# Patient Record
Sex: Female | Born: 1985 | Race: White | Hispanic: No | Marital: Married | Smoking: Never smoker
Health system: Southern US, Community
[De-identification: ages and names within clinical notes are randomized; demographics above are authoritative.]

## PROBLEM LIST (undated history)

## (undated) DIAGNOSIS — Z789 Other specified health status: Secondary | ICD-10-CM

## (undated) DIAGNOSIS — O24419 Gestational diabetes mellitus in pregnancy, unspecified control: Secondary | ICD-10-CM

## (undated) DIAGNOSIS — E039 Hypothyroidism, unspecified: Secondary | ICD-10-CM

## (undated) HISTORY — DX: Other specified health status: Z78.9

## (undated) HISTORY — DX: Gestational diabetes mellitus in pregnancy, unspecified control: O24.419

## (undated) HISTORY — PX: MR LOWER LEG LEFT (ARMC HX): HXRAD1784

## (undated) HISTORY — PX: OTHER SURGICAL HISTORY: SHX169

## (undated) HISTORY — DX: Hypothyroidism, unspecified: E03.9

---

## 2019-03-20 ENCOUNTER — Other Ambulatory Visit: Payer: Self-pay

## 2019-03-20 ENCOUNTER — Ambulatory Visit
Admission: RE | Admit: 2019-03-20 | Discharge: 2019-03-20 | Disposition: A | Discharge status: Home / Self Care | Payer: Managed Care, Other (non HMO) | Source: Ambulatory Visit | Attending: Family Medicine | Admitting: Family Medicine

## 2019-03-20 ENCOUNTER — Other Ambulatory Visit: Payer: Self-pay | Admitting: Family Medicine

## 2019-03-20 DIAGNOSIS — S60032A Contusion of left middle finger without damage to nail, initial encounter: Secondary | ICD-10-CM

## 2020-02-24 IMAGING — CR LEFT MIDDLE FINGER 2+V
3 series · 3 of 3 positions shown · non-contrast
Comparison: None.

CLINICAL DATA: Fall 2 days ago with third digit pain, initial
encounter

EXAM:
LEFT MIDDLE FINGER 2+V

[x finger pa left]
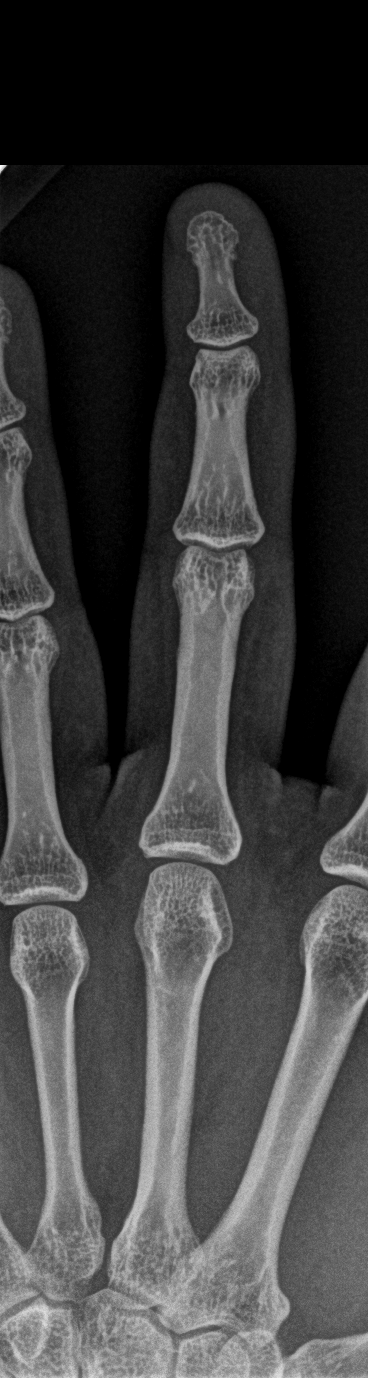

[x finger obl left]
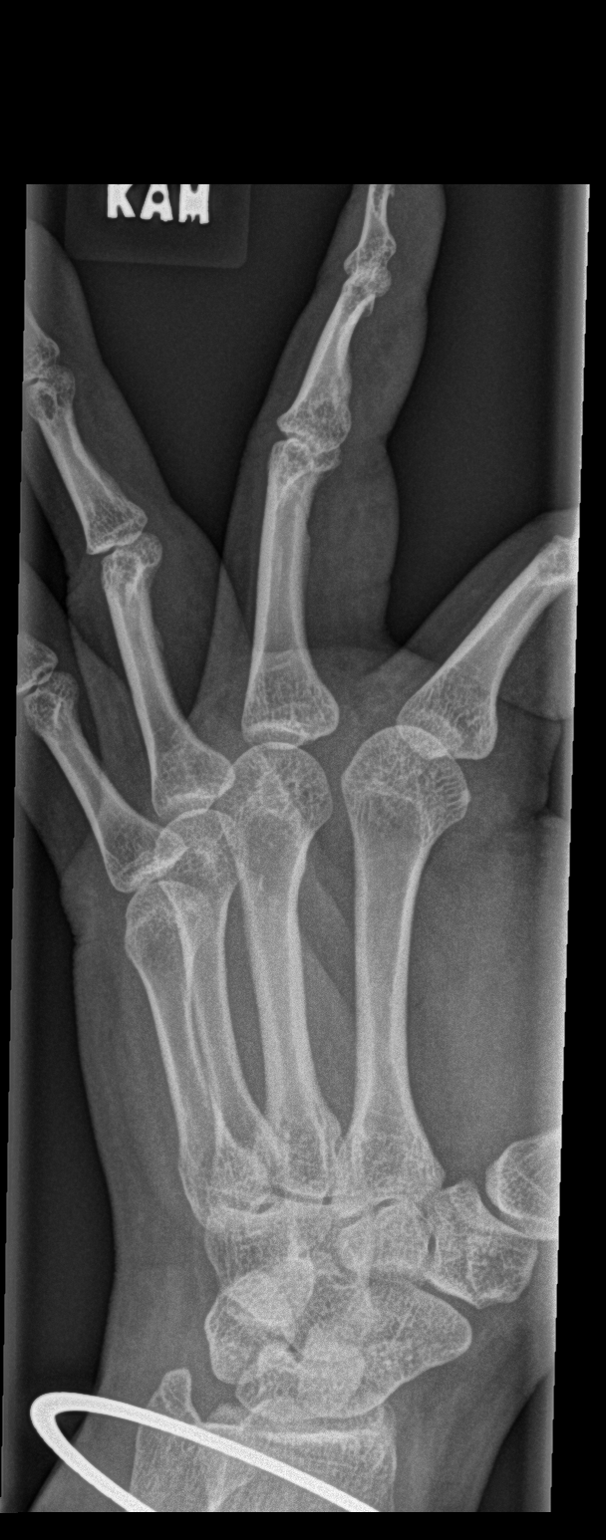

[x finger lat left]
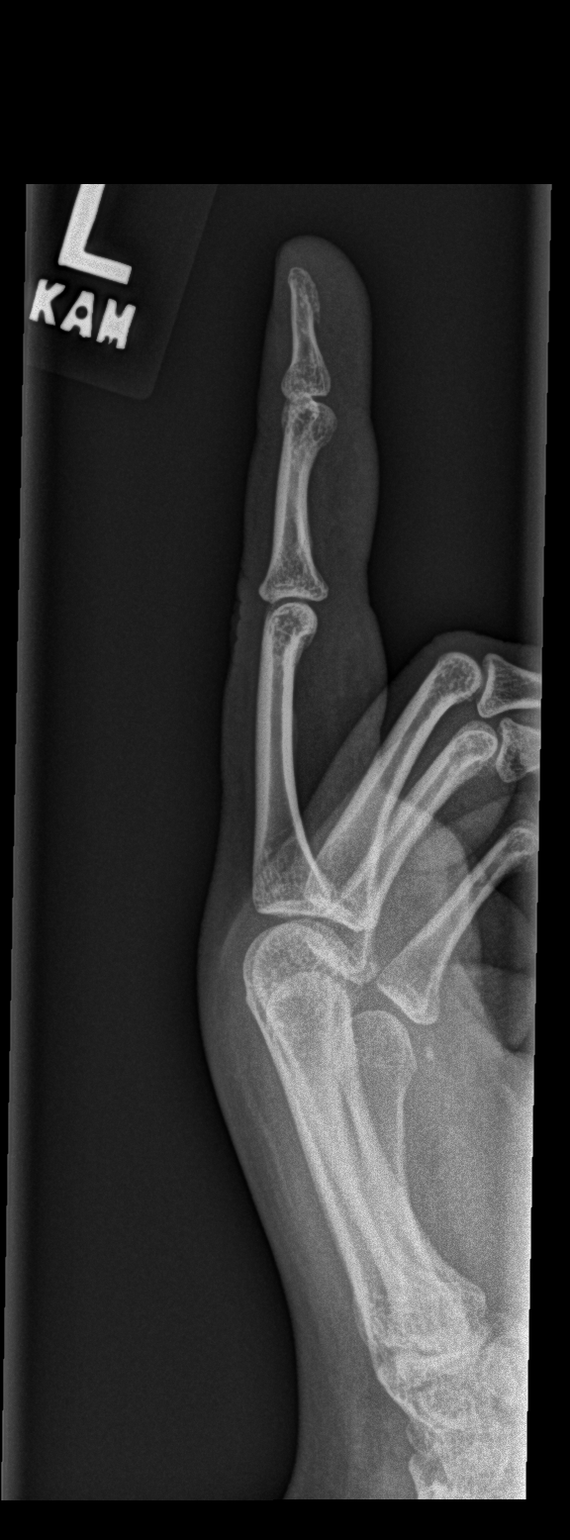

[3 of 3 positions shown; findings below may reference images not displayed]

FINDINGS: There is no evidence of fracture or dislocation. There is no
evidence of arthropathy or other focal bone abnormality. Soft
tissues are unremarkable.
IMPRESSION: No acute abnormality noted.

## 2020-09-11 ENCOUNTER — Other Ambulatory Visit (HOSPITAL_COMMUNITY)
Admission: RE | Admit: 2020-09-11 | Discharge: 2020-09-11 | Disposition: A | Discharge status: Home / Self Care | Payer: Managed Care, Other (non HMO) | Source: Ambulatory Visit | Attending: Family Medicine | Admitting: Family Medicine

## 2020-09-11 ENCOUNTER — Other Ambulatory Visit: Payer: Self-pay | Admitting: Family Medicine

## 2020-09-11 DIAGNOSIS — Z0000 Encounter for general adult medical examination without abnormal findings: Secondary | ICD-10-CM | POA: Diagnosis not present

## 2020-09-19 LAB — CYTOLOGY - PAP
Comment: NEGATIVE
Diagnosis: NEGATIVE
High risk HPV: NEGATIVE

## 2021-03-11 DIAGNOSIS — R7303 Prediabetes: Secondary | ICD-10-CM | POA: Diagnosis not present

## 2021-03-11 DIAGNOSIS — E039 Hypothyroidism, unspecified: Secondary | ICD-10-CM | POA: Diagnosis not present

## 2021-03-26 DIAGNOSIS — Z20822 Contact with and (suspected) exposure to covid-19: Secondary | ICD-10-CM | POA: Diagnosis not present

## 2021-09-22 DIAGNOSIS — M79674 Pain in right toe(s): Secondary | ICD-10-CM | POA: Diagnosis not present

## 2021-09-22 DIAGNOSIS — E039 Hypothyroidism, unspecified: Secondary | ICD-10-CM | POA: Diagnosis not present

## 2021-10-30 ENCOUNTER — Ambulatory Visit (INDEPENDENT_AMBULATORY_CARE_PROVIDER_SITE_OTHER): Payer: BC Managed Care – PPO

## 2021-10-30 ENCOUNTER — Ambulatory Visit (INDEPENDENT_AMBULATORY_CARE_PROVIDER_SITE_OTHER): Payer: BC Managed Care – PPO | Admitting: Podiatry

## 2021-10-30 ENCOUNTER — Other Ambulatory Visit: Payer: Self-pay

## 2021-10-30 DIAGNOSIS — M79671 Pain in right foot: Secondary | ICD-10-CM | POA: Diagnosis not present

## 2021-10-30 DIAGNOSIS — M778 Other enthesopathies, not elsewhere classified: Secondary | ICD-10-CM

## 2021-10-30 DIAGNOSIS — E538 Deficiency of other specified B group vitamins: Secondary | ICD-10-CM | POA: Insufficient documentation

## 2021-10-30 DIAGNOSIS — R7303 Prediabetes: Secondary | ICD-10-CM | POA: Insufficient documentation

## 2021-10-30 DIAGNOSIS — D509 Iron deficiency anemia, unspecified: Secondary | ICD-10-CM | POA: Insufficient documentation

## 2021-10-30 DIAGNOSIS — E039 Hypothyroidism, unspecified: Secondary | ICD-10-CM | POA: Insufficient documentation

## 2021-11-04 ENCOUNTER — Telehealth: Payer: Self-pay | Admitting: Podiatry

## 2021-11-04 MED ORDER — MELOXICAM 15 MG PO TABS: 15.0000 mg | ORAL_TABLET | Freq: Every day | ORAL | 0 refills | Status: DC | PRN

## 2021-11-04 NOTE — Telephone Encounter (Signed)
[facility] at [place]    Pain medication was not called in  -  Patient husband calling to follow up  on this. Please call

## 2021-11-05 NOTE — Telephone Encounter (Signed)
Left message on voicemail that medication has been called in

## 2021-11-05 NOTE — Progress Notes (Signed)
Subjective:   Patient ID: Morgan Douglas, female   DOB: 35 y.o.   MRN: [id number]   HPI 35 year old female presents the office today with her husband present for concerns of right foot pain mostly on the fourth and fifth toe area.  She states that she started a new job standing at work and after 2 weeks of working is when she started noticing discomfort.  No injury that she reports.  At worst her pain is 10/10.  Hurts mostly with working.  She presents today wearing a shoe that is more flexible and states it is not hurting with this type issue.  No numbness or tingling.  No other concerns.   Review of Systems  All other systems reviewed and are negative.  No past medical history on file.    Current Outpatient Medications:    meloxicam (MOBIC) 15 MG tablet, Take 1 tablet (15 mg total) by mouth daily as needed for pain., Disp: 30 tablet, Rfl: 0   triamcinolone cream (KENALOG) 0.1 %, 1 application, Disp: , Rfl:    cyanocobalamin 1000 MCG tablet, Take by mouth., Disp: , Rfl:    EUTHYROX 175 MCG tablet, Take 175 mcg by mouth every morning., Disp: , Rfl:   Not on File        Objective:  Physical Exam  General: AAO x3, NAD  Dermatological: Skin is warm, dry and supple bilateral.There are no open sores, no preulcerative lesions, no rash or signs of infection present.  Vascular: Dorsalis Pedis artery and Posterior Tibial artery pedal pulses are 2/4 bilateral with immedate capillary fill time.  There is no pain with calf compression, swelling, warmth, erythema.   Neruologic: Grossly intact via light touch bilateral.   Musculoskeletal: There is palpation on the left fourth interspace of the right foot.  Mild ischemia in the fourth and fifth toes but this mostly on the MPJs.  No area of pinpoint tenderness.  Trace edema.  There is no erythema or warmth.  Muscular strength 5/5 in all groups tested bilateral.  Gait: Unassisted, Nonantalgic.       Assessment:   Capsulitis right  foot     Plan:  -Treatment options discussed including all alternatives, risks, and complications -Etiology of symptoms were discussed -X-rays obtained reviewed.  No evidence of acute fracture.  Splayfoot present. -As she does not have pain wearing the current shoe, to hold off on immobilization to a surgical shoe which we did discuss today.  We discussed topical anti-inflammatories including Voltaren gel.  Her husband did call and I prescribed meloxicam.  Discussed icing to the area daily.  Discussed change of shoe gear while at work in different style material to the shoe I think will be beneficial for her.  No improvement consider injection.  Vivi Barrack DPM

## 2021-12-01 ENCOUNTER — Other Ambulatory Visit: Payer: Self-pay | Admitting: Podiatry

## 2021-12-31 ENCOUNTER — Other Ambulatory Visit: Payer: Self-pay | Admitting: Podiatry

## 2022-01-08 DIAGNOSIS — R509 Fever, unspecified: Secondary | ICD-10-CM | POA: Diagnosis not present

## 2022-01-08 DIAGNOSIS — J029 Acute pharyngitis, unspecified: Secondary | ICD-10-CM | POA: Diagnosis not present

## 2022-01-08 DIAGNOSIS — B349 Viral infection, unspecified: Secondary | ICD-10-CM | POA: Diagnosis not present

## 2022-01-08 DIAGNOSIS — Z03818 Encounter for observation for suspected exposure to other biological agents ruled out: Secondary | ICD-10-CM | POA: Diagnosis not present

## 2022-06-22 DIAGNOSIS — E039 Hypothyroidism, unspecified: Secondary | ICD-10-CM | POA: Diagnosis not present

## 2022-06-22 DIAGNOSIS — Z13220 Encounter for screening for lipoid disorders: Secondary | ICD-10-CM | POA: Diagnosis not present

## 2022-06-22 DIAGNOSIS — Z0000 Encounter for general adult medical examination without abnormal findings: Secondary | ICD-10-CM | POA: Diagnosis not present

## 2022-06-22 DIAGNOSIS — D509 Iron deficiency anemia, unspecified: Secondary | ICD-10-CM | POA: Diagnosis not present

## 2022-06-22 DIAGNOSIS — R7303 Prediabetes: Secondary | ICD-10-CM | POA: Diagnosis not present

## 2022-08-19 DIAGNOSIS — R799 Abnormal finding of blood chemistry, unspecified: Secondary | ICD-10-CM | POA: Diagnosis not present

## 2022-08-19 DIAGNOSIS — D649 Anemia, unspecified: Secondary | ICD-10-CM | POA: Diagnosis not present

## 2022-08-19 DIAGNOSIS — N644 Mastodynia: Secondary | ICD-10-CM | POA: Diagnosis not present

## 2022-08-19 DIAGNOSIS — Z872 Personal history of diseases of the skin and subcutaneous tissue: Secondary | ICD-10-CM | POA: Diagnosis not present

## 2022-08-20 ENCOUNTER — Other Ambulatory Visit: Payer: Self-pay | Admitting: Family Medicine

## 2022-08-20 DIAGNOSIS — N644 Mastodynia: Secondary | ICD-10-CM

## 2022-09-30 ENCOUNTER — Ambulatory Visit: Payer: Self-pay

## 2022-09-30 ENCOUNTER — Ambulatory Visit
Admission: RE | Admit: 2022-09-30 | Discharge: 2022-09-30 | Disposition: A | Discharge status: Home / Self Care | Payer: BC Managed Care – PPO | Source: Ambulatory Visit | Attending: Family Medicine | Admitting: Family Medicine

## 2022-09-30 DIAGNOSIS — N644 Mastodynia: Secondary | ICD-10-CM

## 2022-09-30 DIAGNOSIS — N611 Abscess of the breast and nipple: Secondary | ICD-10-CM | POA: Diagnosis not present

## 2023-09-07 LAB — OB RESULTS CONSOLE RPR: RPR: NONREACTIVE

## 2023-09-07 LAB — HEPATITIS C ANTIBODY: HCV Ab: NEGATIVE

## 2023-09-07 LAB — OB RESULTS CONSOLE HEPATITIS B SURFACE ANTIGEN: Hepatitis B Surface Ag: NEGATIVE

## 2023-09-07 LAB — OB RESULTS CONSOLE GC/CHLAMYDIA
Chlamydia: NEGATIVE
Neisseria Gonorrhea: NEGATIVE

## 2023-09-07 LAB — OB RESULTS CONSOLE RUBELLA ANTIBODY, IGM: Rubella: NON-IMMUNE/NOT IMMUNE

## 2023-09-07 LAB — OB RESULTS CONSOLE ANTIBODY SCREEN: Antibody Screen: NEGATIVE

## 2023-09-07 LAB — OB RESULTS CONSOLE HIV ANTIBODY (ROUTINE TESTING): HIV: NONREACTIVE

## 2023-09-16 ENCOUNTER — Other Ambulatory Visit: Payer: Self-pay

## 2023-09-16 ENCOUNTER — Other Ambulatory Visit: Payer: Self-pay | Admitting: Obstetrics & Gynecology

## 2023-09-16 ENCOUNTER — Telehealth: Payer: Self-pay

## 2023-09-16 DIAGNOSIS — O285 Abnormal chromosomal and genetic finding on antenatal screening of mother: Secondary | ICD-10-CM

## 2023-09-21 ENCOUNTER — Encounter: Payer: Self-pay | Admitting: *Deleted

## 2023-09-21 DIAGNOSIS — O280 Abnormal hematological finding on antenatal screening of mother: Secondary | ICD-10-CM | POA: Insufficient documentation

## 2023-09-21 DIAGNOSIS — O99210 Obesity complicating pregnancy, unspecified trimester: Secondary | ICD-10-CM | POA: Insufficient documentation

## 2023-09-21 DIAGNOSIS — O09529 Supervision of elderly multigravida, unspecified trimester: Secondary | ICD-10-CM | POA: Insufficient documentation

## 2023-09-22 ENCOUNTER — Other Ambulatory Visit: Payer: Self-pay

## 2023-09-22 ENCOUNTER — Ambulatory Visit: Payer: Managed Care, Other (non HMO)

## 2023-09-22 ENCOUNTER — Other Ambulatory Visit: Payer: Self-pay | Admitting: *Deleted

## 2023-09-22 ENCOUNTER — Ambulatory Visit: Payer: Managed Care, Other (non HMO) | Attending: Obstetrics and Gynecology

## 2023-09-22 VITALS — BP 113/65 | HR 93

## 2023-09-22 DIAGNOSIS — O09529 Supervision of elderly multigravida, unspecified trimester: Secondary | ICD-10-CM | POA: Diagnosis present

## 2023-09-22 DIAGNOSIS — O285 Abnormal chromosomal and genetic finding on antenatal screening of mother: Secondary | ICD-10-CM | POA: Insufficient documentation

## 2023-09-22 DIAGNOSIS — O280 Abnormal hematological finding on antenatal screening of mother: Secondary | ICD-10-CM

## 2023-09-22 DIAGNOSIS — Z3A13 13 weeks gestation of pregnancy: Secondary | ICD-10-CM

## 2023-09-22 DIAGNOSIS — O99211 Obesity complicating pregnancy, first trimester: Secondary | ICD-10-CM

## 2023-09-22 DIAGNOSIS — O99210 Obesity complicating pregnancy, unspecified trimester: Secondary | ICD-10-CM | POA: Diagnosis present

## 2023-09-22 DIAGNOSIS — E669 Obesity, unspecified: Secondary | ICD-10-CM

## 2023-09-22 DIAGNOSIS — O09521 Supervision of elderly multigravida, first trimester: Secondary | ICD-10-CM

## 2023-09-22 DIAGNOSIS — O99281 Endocrine, nutritional and metabolic diseases complicating pregnancy, first trimester: Secondary | ICD-10-CM

## 2023-09-22 DIAGNOSIS — O99810 Abnormal glucose complicating pregnancy: Secondary | ICD-10-CM

## 2023-09-22 DIAGNOSIS — E079 Disorder of thyroid, unspecified: Secondary | ICD-10-CM

## 2023-09-22 NOTE — Progress Notes (Signed)
[facility] at [facility] [address] Phone:  [phone]   Fax:  [phone]    Name: Morgan Douglas Indication: Low fetal fraction on NIPS.  DOB: 1986-07-13 Age: 37 y.o.   EDD: 03/29/2024 LMP: 06/16/2023 Referring Provider:  Deatra James, MD   EGA: [redacted]w[redacted]d  Genetic Counselor: Sheppard Plumber, MS  OB Hx: [id number] Date of Appointment: 09/22/2023  Accompanied by: Morgan Douglas (FOB). Face to Face Time: 60 Minutes   Pregnancy History:   This is Morgan Douglas's second pregnancy. She has one son. Reports she takes PNVs, iron and B12 supplements, and thyroid medication. Personal history of hypothyroidism. Denies personal history of diabetes, high blood pressure, and seizures. Denies bleeding, infections, and fevers in this pregnancy. Denies using tobacco, alcohol, or street drugs in this pregnancy.   Family History: A three-generation pedigree was created and scanned into Epic under the Media tab.  The couple report that their 81 yo son was diagnosed with autism ~2 yo. He has some learning delays. He has not had a genetics evaluation for autism. We discussed that genetic testing for individuals with a clinical diagnosis of autism yields an explanation in only about 20% of cases, and the remaining 80% of cases are left with unknown etiology. Having an affected son increases the chance that the couple's future children will have autism; however, without genetic testing performed on affected family members, it is difficult to assess risk to the pregnancy. The risk may be up to 50% in the case of an identified genetic cause. We also discussed screening for fragile X syndrome, which is one of the most common causes of inherited intellectual disability and autism in males. Fragile X syndrome affects females as well. Morgan Douglas was not found to be a carrier for fragile X syndrome (37 and 29 CGG repeats).  I suggested the couple to speak with their son's  pediatrician for a possible referral to pediatric genetics for an autism evaluation.  Morgan Douglas's reproductive partner Morgan Douglas reports that he had a stent placed ~10 years ago. He reports this was likely due to not taking medications and high stress. He is being followed by a cardiologist.   Maternal ethnicity reported as [place] and paternal ethnicity reported as [place]. Denies Ashkenazi Jewish ancestry.  Family history not remarkable for consanguinity, individuals with birth defects, intellectual disability, multiple spontaneous abortions, still births, or unexplained neonatal death.     Genetic Counseling:   Abnormal NIPS Report:  Morgan Douglas had an abnormal Panorama noninvasive prenatal screening (NIPS) during this pregnancy. The result reported high risk for trisomy 72, trisomy 30, or triploidy due to insufficient fetal fraction. The lab reports a 1 in 17 risk for these conditions based on these results. Morgan Douglas's NIPS was drawn at [redacted]w[redacted]d gestation and had a fetal fraction of 2.0%. Please see lab reports for details.    NIPS analyzes cell-free DNA originating from the placenta that is found in the maternal blood circulation during pregnancy to provide information regarding the presence or absence of extra DNA for chromosomes 13, 18, and 21 as well as the sex chromosomes. Low fetal fraction has been associated with early gestational age, high maternal weight, suboptimal sample collection, pregnancies conceived using in vitro fertilization, use of certain medications such as blood thinners, certain maternal health conditions such as autoimmune disorders, small placentas, fetal chromosomal abnormalities, and normal variation. When low fetal fraction is detected, the lab is not able to analyze the chromosome material from the placenta. Therefore,  the lab then incorporates other factors, such as maternal weight, maternal age, and gestational age to further assess the risk of adverse conditions to the pregnancy.  Based upon the results of the additional analysis, the current pregnancy is at high risk (1 in 85) for triploidy, trisomy 61, or trisomy 30. The risks for trisomy 73 and monosomy X are unchanged. The lab reports this increased risk due to the known association of those conditions with small placentas and low fetal fractions.   We discussed that it is currently unknown why Morgan Douglas has low fetal fraction but may be due to high maternal BMI and early gestation age during the blood draw. We briefly discussed the three conditions that the lab reported the pregnancy is at an increased risk for. Greater than 90% of cases with fetuses affected with trisomy 86, trisomy 35, or triploidy will show signs of the respective condition on ultrasound. Based on these results, we discussed further testing/screening options:  Repeat NIPS. Now that Kerby is a couple of weeks further along in her pregnancy, the fetal fraction may be higher. If the redraw was successful and contained a sufficient fetal fraction, results could clarify the risks for chromosomal aneuploidy in the current pregnancy. The couple was counseled that there is still a chance that a new sample may not contain a sufficient fetal fraction. They were also offered the option of waiting 1-2 additional weeks to allow the placenta to grow further. The couple elected to repeat NIPS today. Morgan Douglas's blood was drawn for Panorama NIPS during today's visit.  Diagnostic Testing. We reviewed the technical aspects, benefits, risks, and limitations of chorionic villus sampling (CVS) and amniocentesis including the 1 in 500 risk for miscarriage. The couple elected to wait for the NIPS results and decide on diagnostic testing accordingly.  Advanced Maternal Age:  We briefly discussed that the chance that Morgan Douglas's current pregnancy would be affected with a chromosome difference based on her age alone is approximately 1 in 123 (~0.8%). This means that there is a 122 in 123  (~99.2%) chance that the fetus would not be affected with a chromosome difference.    Previous Testing Completed:  Morgan Douglas previously completed carrier screening. She screened to not be a carrier for the 14 conditions listed on the test report. Please see report for details. A negative result on carrier screening reduces but does not eliminate the chance of being a carrier.    Patient Plan:  Proceed with: Routine prenatal care. A repeat Panorama NIPS was drawn today.  Informed consent was obtained. All questions were answered.   I spent 60 minutes in the care of the patient today, including face-to-face time reviewing and  discussing the genetic test results and available next steps.   Thank you for sharing in the care of Morgan Douglas with [place].  Please do not hesitate to contact us at [phone] if you have any questions.  Sheppard Plumber, MS Genetic Counselor  Genetic counseling student involved in appointment: No.

## 2023-09-28 LAB — PANORAMA PRENATAL TEST FULL PANEL:PANORAMA TEST PLUS 5 ADDITIONAL MICRODELETIONS: FETAL FRACTION: 3.6

## 2023-09-30 ENCOUNTER — Telehealth: Payer: Self-pay

## 2023-09-30 NOTE — Telephone Encounter (Signed)
I left a voicemail asking patient to call back to discuss test results.

## 2023-09-30 NOTE — Telephone Encounter (Signed)
The patient's husband called back to discuss the Panorama NIPS results. The result is low risk, consistent with a female fetus. This screening significantly reduces but does not eliminate the chance that the current pregnancy has Down syndrome (trisomy 56), trisomy 68, trisomy 54, and common sex chromosome conditions. Please see report for details. Additionally, there are many genetic conditions that cannot be detected by cfDNA.   Fetal sex was not shared with the couple as per their previous request.  Sheppard Plumber, MS Genetic Counselor [facility] [phone]

## 2023-10-12 ENCOUNTER — Ambulatory Visit: Payer: Self-pay

## 2023-11-17 ENCOUNTER — Ambulatory Visit: Payer: Managed Care, Other (non HMO)

## 2024-01-26 ENCOUNTER — Encounter: Attending: Obstetrics & Gynecology | Admitting: Dietician

## 2024-01-26 DIAGNOSIS — O24410 Gestational diabetes mellitus in pregnancy, diet controlled: Secondary | ICD-10-CM | POA: Insufficient documentation

## 2024-01-26 NOTE — Progress Notes (Signed)
 Patient was seen on 01/26/2024 for Gestational Diabetes self-management class at the [facility]. The following learning objectives were met by the patient during this course:  States the definition of Gestational Diabetes States why dietary management is important in controlling blood glucose Describes the effects each nutrient has on blood glucose levels Demonstrates ability to create a balanced meal plan Demonstrates carbohydrate counting  States when to check blood glucose levels Demonstrates proper blood glucose monitoring techniques States the effect of stress and exercise on blood glucose levels States the importance of limiting caffeine and abstaining from alcohol and smoking Introduction of insulin management including mixing, measuring with Pen and syringe, rotating injections sites and treatment/preventions of hypoglycemia   Patient has a meter prior to visit. Patient is instructed to test pre breakfast and 2 hours after each meal. FBS: 96, 115, 128, 99, 127, 164, 128 mg/dL  2 hour Postprandial: Breakfast: 177, 120, 123, 142, 103, 157 mg/dL  Lunch: 387, 564, 332, 123, 128, 115, 146 mg/dL  Dinner: 951, 884, 166, 177, 186, 130 mg/dL    Patient instructed to monitor glucose levels: QID FBS: 60 - <90 1 hour: <140 2 hour: <120  *Patient received handouts: Nutrition Diabetes and Pregnancy Carbohydrate Counting List Blood glucose log Snack ideas for diabetes during pregnancy  Patient will be seen for follow-up as needed.

## 2024-03-02 ENCOUNTER — Telehealth (HOSPITAL_COMMUNITY): Payer: Self-pay | Admitting: *Deleted

## 2024-03-02 LAB — OB RESULTS CONSOLE GBS: GBS: NEGATIVE

## 2024-03-02 NOTE — Telephone Encounter (Signed)
 Preadmission screen

## 2024-03-07 ENCOUNTER — Encounter (HOSPITAL_COMMUNITY): Payer: Self-pay | Admitting: *Deleted

## 2024-03-20 ENCOUNTER — Other Ambulatory Visit: Payer: Self-pay | Admitting: Obstetrics & Gynecology

## 2024-03-23 ENCOUNTER — Inpatient Hospital Stay (HOSPITAL_COMMUNITY)
Admission: RE | Admit: 2024-03-23 | Discharge: 2024-03-26 | DRG: 784 | Disposition: A | Discharge status: Home / Self Care | Attending: Obstetrics and Gynecology | Admitting: Obstetrics and Gynecology

## 2024-03-23 ENCOUNTER — Encounter (HOSPITAL_COMMUNITY): Payer: Self-pay | Admitting: Obstetrics & Gynecology

## 2024-03-23 ENCOUNTER — Inpatient Hospital Stay (HOSPITAL_COMMUNITY): Admission: RE | Admit: 2024-03-23 | Source: Ambulatory Visit

## 2024-03-23 DIAGNOSIS — Z79890 Hormone replacement therapy (postmenopausal): Secondary | ICD-10-CM

## 2024-03-23 DIAGNOSIS — O99284 Endocrine, nutritional and metabolic diseases complicating childbirth: Secondary | ICD-10-CM | POA: Diagnosis present

## 2024-03-23 DIAGNOSIS — D62 Acute posthemorrhagic anemia: Secondary | ICD-10-CM | POA: Diagnosis not present

## 2024-03-23 DIAGNOSIS — E039 Hypothyroidism, unspecified: Secondary | ICD-10-CM | POA: Diagnosis present

## 2024-03-23 DIAGNOSIS — O9903 Anemia complicating the puerperium: Secondary | ICD-10-CM | POA: Diagnosis not present

## 2024-03-23 DIAGNOSIS — D509 Iron deficiency anemia, unspecified: Secondary | ICD-10-CM | POA: Diagnosis present

## 2024-03-23 DIAGNOSIS — Z3A39 39 weeks gestation of pregnancy: Secondary | ICD-10-CM | POA: Diagnosis not present

## 2024-03-23 DIAGNOSIS — O24429 Gestational diabetes mellitus in childbirth, unspecified control: Secondary | ICD-10-CM | POA: Insufficient documentation

## 2024-03-23 DIAGNOSIS — O99892 Other specified diseases and conditions complicating childbirth: Secondary | ICD-10-CM | POA: Diagnosis present

## 2024-03-23 DIAGNOSIS — O99214 Obesity complicating childbirth: Secondary | ICD-10-CM | POA: Diagnosis present

## 2024-03-23 DIAGNOSIS — O24425 Gestational diabetes mellitus in childbirth, controlled by oral hypoglycemic drugs: Secondary | ICD-10-CM | POA: Diagnosis present

## 2024-03-23 DIAGNOSIS — Z3A00 Weeks of gestation of pregnancy not specified: Secondary | ICD-10-CM | POA: Diagnosis not present

## 2024-03-23 DIAGNOSIS — Z302 Encounter for sterilization: Secondary | ICD-10-CM

## 2024-03-23 DIAGNOSIS — Z3490 Encounter for supervision of normal pregnancy, unspecified, unspecified trimester: Principal | ICD-10-CM | POA: Diagnosis present

## 2024-03-23 LAB — CBC
HCT: 36 % (ref 36.0–46.0)
Hemoglobin: 11.8 g/dL — ABNORMAL LOW (ref 12.0–15.0)
MCH: 27 pg (ref 26.0–34.0)
MCHC: 32.8 g/dL (ref 30.0–36.0)
MCV: 82.4 fL (ref 80.0–100.0)
Platelets: 285 10*3/uL (ref 150–400)
RBC: 4.37 MIL/uL (ref 3.87–5.11)
RDW: 14.3 % (ref 11.5–15.5)
WBC: 11.5 10*3/uL — ABNORMAL HIGH (ref 4.0–10.5)
nRBC: 0 % (ref 0.0–0.2)

## 2024-03-23 LAB — TYPE AND SCREEN
ABO/RH(D): A POS
Antibody Screen: NEGATIVE

## 2024-03-23 LAB — GLUCOSE, CAPILLARY
Glucose-Capillary: 114 mg/dL — ABNORMAL HIGH (ref 70–99)
Glucose-Capillary: 139 mg/dL — ABNORMAL HIGH (ref 70–99)
Glucose-Capillary: 74 mg/dL (ref 70–99)
Glucose-Capillary: 79 mg/dL (ref 70–99)
Glucose-Capillary: 83 mg/dL (ref 70–99)
Glucose-Capillary: 89 mg/dL (ref 70–99)

## 2024-03-23 LAB — RPR: RPR Ser Ql: NONREACTIVE

## 2024-03-23 MED ORDER — MISOPROSTOL 25 MCG QUARTER TABLET
25.0000 ug | ORAL_TABLET | ORAL | Status: DC | PRN
  Administered 2024-03-23 (×2): 25 ug via VAGINAL
  Filled 2024-03-23 (×2): qty 1

## 2024-03-23 MED ORDER — LIDOCAINE HCL (PF) 1 % IJ SOLN: 30.0000 mL | INTRAMUSCULAR | Status: DC | PRN

## 2024-03-23 MED ORDER — OXYTOCIN-SODIUM CHLORIDE 30-0.9 UT/500ML-% IV SOLN
1.0000 m[IU]/min | INTRAVENOUS | Status: DC
  Administered 2024-03-23: 2 m[IU]/min via INTRAVENOUS
  Filled 2024-03-23: qty 500

## 2024-03-23 MED ORDER — TERBUTALINE SULFATE 1 MG/ML IJ SOLN: 0.2500 mg | Freq: Once | INTRAMUSCULAR | Status: DC | PRN

## 2024-03-23 MED ORDER — OXYTOCIN BOLUS FROM INFUSION: 333.0000 mL | Freq: Once | INTRAVENOUS | Status: DC

## 2024-03-23 MED ORDER — FENTANYL CITRATE (PF) 100 MCG/2ML IJ SOLN
100.0000 ug | INTRAMUSCULAR | Status: DC | PRN
  Administered 2024-03-23 – 2024-03-24 (×2): 100 ug via INTRAVENOUS
  Filled 2024-03-23 (×2): qty 2

## 2024-03-23 MED ORDER — EPHEDRINE 5 MG/ML INJ: 10.0000 mg | INTRAVENOUS | Status: DC | PRN

## 2024-03-23 MED ORDER — ACETAMINOPHEN 325 MG PO TABS: 650.0000 mg | ORAL_TABLET | ORAL | Status: DC | PRN

## 2024-03-23 MED ORDER — LEVOTHYROXINE SODIUM 100 MCG PO TABS
200.0000 ug | ORAL_TABLET | Freq: Every day | ORAL | Status: DC
  Administered 2024-03-23 – 2024-03-24 (×2): 200 ug via ORAL
  Filled 2024-03-23 (×2): qty 2

## 2024-03-23 MED ORDER — OXYTOCIN-SODIUM CHLORIDE 30-0.9 UT/500ML-% IV SOLN: 2.5000 [IU]/h | INTRAVENOUS | Status: DC

## 2024-03-23 MED ORDER — PHENYLEPHRINE 80 MCG/ML (10ML) SYRINGE FOR IV PUSH (FOR BLOOD PRESSURE SUPPORT)
80.0000 ug | PREFILLED_SYRINGE | INTRAVENOUS | Status: DC | PRN
  Filled 2024-03-23: qty 10

## 2024-03-23 MED ORDER — LACTATED RINGERS IV SOLN: INTRAVENOUS | Status: DC

## 2024-03-23 MED ORDER — LACTATED RINGERS IV SOLN: 500.0000 mL | INTRAVENOUS | Status: DC | PRN

## 2024-03-23 MED ORDER — LACTATED RINGERS IV SOLN
500.0000 mL | Freq: Once | INTRAVENOUS | Status: AC
  Administered 2024-03-24: 500 mL via INTRAVENOUS

## 2024-03-23 MED ORDER — ONDANSETRON HCL 4 MG/2ML IJ SOLN: 4.0000 mg | Freq: Four times a day (QID) | INTRAMUSCULAR | Status: DC | PRN

## 2024-03-23 MED ORDER — PHENYLEPHRINE 80 MCG/ML (10ML) SYRINGE FOR IV PUSH (FOR BLOOD PRESSURE SUPPORT)
80.0000 ug | PREFILLED_SYRINGE | INTRAVENOUS | Status: DC | PRN
  Administered 2024-03-24: 80 ug via INTRAVENOUS

## 2024-03-23 MED ORDER — FENTANYL-BUPIVACAINE-NACL 0.5-0.125-0.9 MG/250ML-% EP SOLN
12.0000 mL/h | EPIDURAL | Status: DC | PRN
  Administered 2024-03-24: 10.5 mL/h via EPIDURAL
  Filled 2024-03-23: qty 250

## 2024-03-23 MED ORDER — SOD CITRATE-CITRIC ACID 500-334 MG/5ML PO SOLN
30.0000 mL | ORAL | Status: DC | PRN
  Administered 2024-03-24: 30 mL via ORAL
  Filled 2024-03-23: qty 30

## 2024-03-23 MED ORDER — MISOPROSTOL 50MCG HALF TABLET
50.0000 ug | ORAL_TABLET | Freq: Once | ORAL | Status: AC
  Administered 2024-03-23: 50 ug via BUCCAL
  Filled 2024-03-23: qty 1

## 2024-03-23 MED ORDER — DIPHENHYDRAMINE HCL 50 MG/ML IJ SOLN: 12.5000 mg | INTRAMUSCULAR | Status: DC | PRN

## 2024-03-23 NOTE — Progress Notes (Signed)
 Not really feeling contractions  Patient Vitals for the past 24 hrs:  BP Temp Temp src Pulse Resp Height Weight  03/23/24 0945 123/71 -- -- 94 -- -- --  03/23/24 0717 122/69 -- -- 84 -- -- --  03/23/24 0713 -- 98.3 F (36.8 C) Oral -- 19 -- --  03/23/24 0505 114/61 97.7 F (36.5 C) Oral 85 16 -- --  03/23/24 0255 118/69 -- -- 82 16 -- --  03/23/24 0044 118/71 98 F (36.7 C) Oral 89 16 -- --  03/23/24 0007 -- -- -- -- -- 5\' 3"  (1.6 m) 128.5 kg   A&ox3 Nml respirations Abd: soft, nt, nd, gravid Cx: ft/20/-3; posterior; speculum placed and cook balloon advanced using stylet, 60cc inflated into balloon; pt with moderate discomfort but resolved after completed LE: tr edema,nt bilat  FHT: 130x, nml variability, +accels, no decels TOCO: irreg ctx  A/P: 38 y/o G2P1001 at 21.1 wga IOL - s/p cytotec x2, balloon placed, will plan buccal cytotec since so thick and posterior; now and then plan pitocin; plan svd though guarded; h/o difficult vavd with 6'2" baby Gdma2 - bs 139 on admission but had just eaten; most recent 79 and contin a 4hr check; EFW 8 LBS Hypothyroid- synthroid Rubella ni - MMR PP Rh pos Gbs neg Bmi 50

## 2024-03-23 NOTE — H&P (Signed)
 Markeita Mah is a 38 y.o. female presenting for IOL at 39 wks for A2GDM AMA, hypothyroid, A2GDM, obesity pre-preg A1C 5.9 07/28/23. Iron deficiency, B12 deficiency  G2P1001. EDC by sono- 03/29/24. Nl ovaries  G1- h/o Previa but moved, VAVD, Boy, 6'1" , 2016. Long labor, son has mild autism.   AMA, NIPT - inadequate fractions x 2 but 3rd NIPT was LOW Risk. Saw MFM. Declined invasive testing.  Boy. Anatomy nl    GDM in 3rd tirm after passing early glucola at 16 wks. Metformin 1000 mg at night Serial growth -  28 wks EFW 3 lbs 83% AC 83% BPD 82% .  32 wks EFW - 5 lb 87% AC 96% AFI 21 cm Vx. shoulder dystocia risk dw pt.  36 wks EFW 6'10" 68% AC 77% BPD 78% AFI 13 cm.\  NST/ BPP 10/10 AFI nl   TFT nl each trim Synthroid 200mcg   . OB History     Gravida  2   Para  1   Term  1   Preterm      AB      Living  1      SAB      IAB      Ectopic      Multiple      Live Births  1          Past Medical History:  Diagnosis Date   Gestational diabetes    Hypothyroidism    Medical history non-contributory    Past Surgical History:  Procedure Laterality Date   breast abcess     MR LOWER LEG LEFT ([facility] HX)     Family History: family history is not on file. Social History:  reports that she has never smoked. She has never used smokeless tobacco. She reports that she does not drink alcohol and does not use drugs.     Maternal Diabetes: Yes:  Diabetes Type:  Insulin/Medication controlled Genetic Screening: Normal LR NIPT after 2 inadequate fractions  Maternal Ultrasounds/Referrals: Normal Fetal Ultrasounds or other Referrals:  Referred to Materal Fetal Medicine  Maternal Substance Abuse:  No Significant Maternal Medications:  Meds include: Other:  Levothyroxine, bbASA, Metformin 1000 mg at night  Significant Maternal Lab Results:  Group B Strep negative Number of Prenatal Visits:greater than 3 verified prenatal visits Maternal Vaccinations:TDap Other Comments:   None  Review of Systems History Dilation: Closed Effacement (%): Thick Station: -3 Exam by:: Adolm Hooks,.RN Blood pressure 122/69, pulse 84, temperature 98.3 F (36.8 C), temperature source Oral, resp. rate 19, height 5\' 3"  (1.6 m), weight 128.5 kg, last menstrual period 06/16/2023. Exam Physical Exam  Prenatal labs: BP 122/69   Pulse 84   Temp 98.3 F (36.8 C) (Oral)   Resp 19   Ht 5\' 3"  (1.6 m)   Wt 128.5 kg   LMP 06/16/2023   BMI 50.18 kg/m  Physical exam:  A&O x 3, no acute distress. Pleasant HEENT neg, no thyromegaly Lungs CTA bilat CV RRR, S1S2 normal Abdo soft, non tender, non acute Extr no edema/ tenderness Pelvic see per RN HIgh unengaged head  FHT  130s mod variab + accels no decels  Toco none  ABO, Rh: --/--/A POS (05/15 0004) Antibody: NEG (05/15 0004) Rubella: Nonimmune (10/29 0000) VZ immune RPR: NON REACTIVE (05/15 0030)  HBsAg: Negative (10/29 0000)  Hep C neg  HIV: Non-reactive (10/29 0000)  GBS: Negative/-- (04/24 0000)  NIPT LR (was 3rd attempt)  AFP1 neg   Assessment/Plan: 37  yo G2P1001. 39 wks here for IOL for A2GDM A2GDM, BS q 4 hrs in active labor  Hypothyr cont LevoT4 Obesity ambulate  AMA   IOL w/ Cytotec x 2 doses, then pitocin and attempt cervical balloon Prior difficult VAVD 6'2", EFW  7.1/2 lbs - 8 lbs now. Watch progress, not a candidate for vacuum w/ GDM due to increased shoulder dystocia risk, pt counseled, accepts all interventions, prefers vaginal delivery   Lillieann Pavlich R Bayard More 03/23/2024, 7:56 AM

## 2024-03-23 NOTE — Progress Notes (Signed)
 IOL 39 wks for A2GDM   Feels some contraction pain, mild. Foley expelled   BP (!) 100/56   Pulse 93   Temp 99.1 F (37.3 C) (Axillary)   Resp 19   Ht 5\' 3"  (1.6 m)   Wt 128.5 kg   LMP 06/16/2023   BMI 50.18 kg/m   CBG (last 3)  Recent Labs    03/23/24 1004 03/23/24 1355 03/23/24 1742  GLUCAP 79 74 83     FHT: 140s, moderate variability, +accels, no decels cat I  TOCO:reg ctx q 2-3 minutes pitocin  SVD 4/30%/-5 head above inlet slightly off to maternal left   tolerating exam poorly   A/P: 38 y/o G2P1001 at 39.1 wks IOL - now pitocin per protocol; working towards SVD though guarded Try exaggerates Sims without peanut to aid head engagement, attempt AROM after that.  Gdma2 - EFW 8 lbs, BS q 4 hrs  Hypothyroid- synthroid Rubella NI - MMR PP Rh pos Gbs neg Bmi 50  Terri Fester MD

## 2024-03-23 NOTE — Progress Notes (Signed)
 IOL 39 wks for A2GDM   Feels some contraction pain, resting most day and ambulating some  S/p 3 doses of Cytotec and had cervical foley placed this morning but not expelled yet   Patient Vitals for the past 24 hrs:  BP Temp Temp src Pulse Resp Height Weight  03/23/24 1539 134/75 -- -- 87 20 -- --  03/23/24 1350 (!) 107/51 -- -- 89 17 -- --  03/23/24 1205 131/73 98.2 F (36.8 C) Oral 87 17 -- --  03/23/24 0945 123/71 -- -- 94 -- -- --  03/23/24 0717 122/69 -- -- 84 -- -- --  03/23/24 0713 -- 98.3 F (36.8 C) Oral -- 19 -- --  03/23/24 0505 114/61 97.7 F (36.5 C) Oral 85 16 -- --  03/23/24 0255 118/69 -- -- 82 16 -- --  03/23/24 0044 118/71 98 F (36.7 C) Oral 89 16 -- --  03/23/24 0007 -- -- -- -- -- 5\' 3"  (1.6 m) 128.5 kg   CBG (last 3)  Recent Labs    03/23/24 0505 03/23/24 1004 03/23/24 1355  GLUCAP 114* 79 74     A&ox3 Abd: soft, nt, nd, gravid Cx: exam deferred  LE: tr edema,nt bilat  FHT: 140x, nml variability, +accels, no decels cat I  TOCO:reg ctx q 2-3 minutes   A/P: 38 y/o G2P1001 at 39.1 wga IOL - now pitocin per protocol; working towards SVD though guarded; h/o difficult vavd with 6'2" baby Gdma2 - EFW 8 lbs, BS q 4 hrs  Hypothyroid- synthroid Rubella NI - MMR PP Rh pos Gbs neg Bmi 50

## 2024-03-23 NOTE — Plan of Care (Signed)

## 2024-03-24 ENCOUNTER — Inpatient Hospital Stay (HOSPITAL_COMMUNITY): Admitting: Anesthesiology

## 2024-03-24 ENCOUNTER — Encounter (HOSPITAL_COMMUNITY)
Admission: RE | Disposition: A | Discharge status: Home / Self Care | Payer: Self-pay | Source: Home / Self Care | Attending: Obstetrics & Gynecology

## 2024-03-24 ENCOUNTER — Other Ambulatory Visit: Payer: Self-pay

## 2024-03-24 ENCOUNTER — Encounter (HOSPITAL_COMMUNITY): Payer: Self-pay | Admitting: Obstetrics & Gynecology

## 2024-03-24 DIAGNOSIS — Z3A00 Weeks of gestation of pregnancy not specified: Secondary | ICD-10-CM

## 2024-03-24 DIAGNOSIS — O99892 Other specified diseases and conditions complicating childbirth: Secondary | ICD-10-CM | POA: Diagnosis present

## 2024-03-24 HISTORY — PX: TUBAL LIGATION: SHX77

## 2024-03-24 LAB — CBC
HCT: 30.4 % — ABNORMAL LOW (ref 36.0–46.0)
Hemoglobin: 9.9 g/dL — ABNORMAL LOW (ref 12.0–15.0)
MCH: 26.9 pg (ref 26.0–34.0)
MCHC: 32.6 g/dL (ref 30.0–36.0)
MCV: 82.6 fL (ref 80.0–100.0)
Platelets: 241 10*3/uL (ref 150–400)
RBC: 3.68 MIL/uL — ABNORMAL LOW (ref 3.87–5.11)
RDW: 14.3 % (ref 11.5–15.5)
WBC: 15.1 10*3/uL — ABNORMAL HIGH (ref 4.0–10.5)
nRBC: 0 % (ref 0.0–0.2)

## 2024-03-24 LAB — GLUCOSE, CAPILLARY
Glucose-Capillary: 97 mg/dL (ref 70–99)
Glucose-Capillary: 110 mg/dL — ABNORMAL HIGH (ref 70–99)
Glucose-Capillary: 115 mg/dL — ABNORMAL HIGH (ref 70–99)
Glucose-Capillary: 98 mg/dL (ref 70–99)
Glucose-Capillary: 102 mg/dL — ABNORMAL HIGH (ref 70–99)
Glucose-Capillary: 116 mg/dL — ABNORMAL HIGH (ref 70–99)

## 2024-03-24 SURGERY — Surgical Case
Anesthesia: Epidural | Site: Abdomen

## 2024-03-24 MED ORDER — PHENYLEPHRINE 80 MCG/ML (10ML) SYRINGE FOR IV PUSH (FOR BLOOD PRESSURE SUPPORT)
PREFILLED_SYRINGE | INTRAVENOUS | Status: DC | PRN
  Administered 2024-03-24 (×5): 80 ug via INTRAVENOUS

## 2024-03-24 MED ORDER — KETOROLAC TROMETHAMINE 30 MG/ML IJ SOLN
30.0000 mg | Freq: Once | INTRAMUSCULAR | Status: AC
  Administered 2024-03-24: 30 mg via INTRAVENOUS

## 2024-03-24 MED ORDER — ZOLPIDEM TARTRATE 5 MG PO TABS: 5.0000 mg | ORAL_TABLET | Freq: Every evening | ORAL | Status: DC | PRN

## 2024-03-24 MED ORDER — SIMETHICONE 80 MG PO CHEW: 80.0000 mg | CHEWABLE_TABLET | ORAL | Status: DC | PRN

## 2024-03-24 MED ORDER — NALOXONE HCL 0.4 MG/ML IJ SOLN: 0.4000 mg | INTRAMUSCULAR | Status: DC | PRN

## 2024-03-24 MED ORDER — SODIUM CHLORIDE 0.9 % IV SOLN
500.0000 mg | INTRAVENOUS | Status: AC
  Administered 2024-03-24: 500 mg via INTRAVENOUS

## 2024-03-24 MED ORDER — ACETAMINOPHEN 10 MG/ML IV SOLN
1000.0000 mg | Freq: Once | INTRAVENOUS | Status: AC
  Administered 2024-03-24: 1000 mg via INTRAVENOUS

## 2024-03-24 MED ORDER — OXYTOCIN-SODIUM CHLORIDE 30-0.9 UT/500ML-% IV SOLN: 2.5000 [IU]/h | INTRAVENOUS | Status: AC

## 2024-03-24 MED ORDER — LIDOCAINE HCL (PF) 1 % IJ SOLN
INTRAMUSCULAR | Status: DC | PRN
  Administered 2024-03-24 (×2): 4 mL via EPIDURAL

## 2024-03-24 MED ORDER — DROPERIDOL 2.5 MG/ML IJ SOLN: 0.6250 mg | Freq: Once | INTRAMUSCULAR | Status: DC | PRN

## 2024-03-24 MED ORDER — POVIDONE-IODINE 10 % EX SWAB: 2.0000 | Freq: Once | CUTANEOUS | Status: DC

## 2024-03-24 MED ORDER — PRENATAL MULTIVITAMIN CH
1.0000 | ORAL_TABLET | Freq: Every day | ORAL | Status: DC
  Administered 2024-03-25 – 2024-03-26 (×2): 1 via ORAL
  Filled 2024-03-24 (×2): qty 1

## 2024-03-24 MED ORDER — TRANEXAMIC ACID-NACL 1000-0.7 MG/100ML-% IV SOLN
1000.0000 mg | Freq: Once | INTRAVENOUS | Status: AC
  Administered 2024-03-24: 1000 mg via INTRAVENOUS

## 2024-03-24 MED ORDER — KETOROLAC TROMETHAMINE 30 MG/ML IJ SOLN: 30.0000 mg | Freq: Four times a day (QID) | INTRAMUSCULAR | Status: AC | PRN

## 2024-03-24 MED ORDER — FENTANYL CITRATE (PF) 100 MCG/2ML IJ SOLN
INTRAMUSCULAR | Status: DC | PRN
  Administered 2024-03-24: 100 ug via EPIDURAL

## 2024-03-24 MED ORDER — FENTANYL CITRATE (PF) 100 MCG/2ML IJ SOLN: 25.0000 ug | INTRAMUSCULAR | Status: DC | PRN

## 2024-03-24 MED ORDER — ALBUMIN HUMAN 5 % IV SOLN: INTRAVENOUS | Status: DC | PRN

## 2024-03-24 MED ORDER — ONDANSETRON HCL 4 MG/2ML IJ SOLN
INTRAMUSCULAR | Status: DC | PRN
  Administered 2024-03-24: 4 mg via INTRAVENOUS

## 2024-03-24 MED ORDER — NALOXONE HCL 4 MG/10ML IJ SOLN: 1.0000 ug/kg/h | INTRAVENOUS | Status: DC | PRN

## 2024-03-24 MED ORDER — CEFAZOLIN SODIUM-DEXTROSE 3-4 GM/150ML-% IV SOLN
3.0000 g | INTRAVENOUS | Status: AC
  Administered 2024-03-24 (×2): 3 g via INTRAVENOUS
  Filled 2024-03-24: qty 150

## 2024-03-24 MED ORDER — ACETAMINOPHEN 10 MG/ML IV SOLN
INTRAVENOUS | Status: AC
  Filled 2024-03-24: qty 100

## 2024-03-24 MED ORDER — FENTANYL CITRATE (PF) 100 MCG/2ML IJ SOLN
INTRAMUSCULAR | Status: AC
Start: 2024-03-24 — End: 2024-03-24
  Filled 2024-03-24: qty 2

## 2024-03-24 MED ORDER — SENNOSIDES-DOCUSATE SODIUM 8.6-50 MG PO TABS
2.0000 | ORAL_TABLET | Freq: Every day | ORAL | Status: DC
  Administered 2024-03-25 – 2024-03-26 (×2): 2 via ORAL
  Filled 2024-03-24 (×3): qty 2

## 2024-03-24 MED ORDER — DIPHENHYDRAMINE HCL 25 MG PO CAPS: 25.0000 mg | ORAL_CAPSULE | ORAL | Status: DC | PRN

## 2024-03-24 MED ORDER — ACETAMINOPHEN 10 MG/ML IV SOLN: 1000.0000 mg | Freq: Four times a day (QID) | INTRAVENOUS | Status: DC

## 2024-03-24 MED ORDER — ACETAMINOPHEN 500 MG PO TABS
1000.0000 mg | ORAL_TABLET | Freq: Four times a day (QID) | ORAL | Status: AC
  Administered 2024-03-24 – 2024-03-25 (×4): 1000 mg via ORAL
  Filled 2024-03-24 (×5): qty 2

## 2024-03-24 MED ORDER — OXYTOCIN-SODIUM CHLORIDE 30-0.9 UT/500ML-% IV SOLN
INTRAVENOUS | Status: AC
  Filled 2024-03-24: qty 500

## 2024-03-24 MED ORDER — OXYTOCIN-SODIUM CHLORIDE 30-0.9 UT/500ML-% IV SOLN
INTRAVENOUS | Status: DC | PRN
  Administered 2024-03-24: 300 mL via INTRAVENOUS

## 2024-03-24 MED ORDER — OXYCODONE HCL 5 MG PO TABS: 5.0000 mg | ORAL_TABLET | ORAL | Status: DC | PRN

## 2024-03-24 MED ORDER — MORPHINE SULFATE (PF) 0.5 MG/ML IJ SOLN
INTRAMUSCULAR | Status: DC | PRN
  Administered 2024-03-24: 3 mg via EPIDURAL

## 2024-03-24 MED ORDER — STERILE WATER FOR IRRIGATION IR SOLN
Status: DC | PRN
  Administered 2024-03-24: 1

## 2024-03-24 MED ORDER — DIBUCAINE (PERIANAL) 1 % EX OINT: 1.0000 | TOPICAL_OINTMENT | CUTANEOUS | Status: DC | PRN

## 2024-03-24 MED ORDER — LACTATED RINGERS IV SOLN: INTRAVENOUS | Status: DC

## 2024-03-24 MED ORDER — DEXAMETHASONE SODIUM PHOSPHATE 10 MG/ML IJ SOLN
INTRAMUSCULAR | Status: DC | PRN
  Administered 2024-03-24: 5 mg via INTRAVENOUS

## 2024-03-24 MED ORDER — DIPHENHYDRAMINE HCL 50 MG/ML IJ SOLN: 12.5000 mg | INTRAMUSCULAR | Status: DC | PRN

## 2024-03-24 MED ORDER — KETOROLAC TROMETHAMINE 30 MG/ML IJ SOLN
30.0000 mg | Freq: Four times a day (QID) | INTRAMUSCULAR | Status: AC
  Administered 2024-03-24 – 2024-03-25 (×3): 30 mg via INTRAVENOUS
  Filled 2024-03-24 (×4): qty 1

## 2024-03-24 MED ORDER — COCONUT OIL OIL: 1.0000 | TOPICAL_OIL | Status: DC | PRN

## 2024-03-24 MED ORDER — LEVOTHYROXINE SODIUM 100 MCG PO TABS
200.0000 ug | ORAL_TABLET | Freq: Every day | ORAL | Status: DC
  Administered 2024-03-25 – 2024-03-26 (×2): 200 ug via ORAL
  Filled 2024-03-24 (×2): qty 2

## 2024-03-24 MED ORDER — KETOROLAC TROMETHAMINE 30 MG/ML IJ SOLN
INTRAMUSCULAR | Status: AC
  Filled 2024-03-24: qty 1

## 2024-03-24 MED ORDER — ONDANSETRON HCL 4 MG/2ML IJ SOLN
INTRAMUSCULAR | Status: AC
  Filled 2024-03-24: qty 2

## 2024-03-24 MED ORDER — ACETAMINOPHEN 500 MG PO TABS: 1000.0000 mg | ORAL_TABLET | Freq: Four times a day (QID) | ORAL | Status: DC

## 2024-03-24 MED ORDER — LIDOCAINE-EPINEPHRINE (PF) 2 %-1:200000 IJ SOLN
INTRAMUSCULAR | Status: DC | PRN
  Administered 2024-03-24: 3 mL via EPIDURAL
  Administered 2024-03-24 (×2): 5 mL via EPIDURAL

## 2024-03-24 MED ORDER — SODIUM CHLORIDE 0.9% FLUSH: 3.0000 mL | INTRAVENOUS | Status: DC | PRN

## 2024-03-24 MED ORDER — SODIUM CHLORIDE 0.9 % IV SOLN
500.0000 mg | Freq: Once | INTRAVENOUS | Status: DC
  Filled 2024-03-24: qty 25

## 2024-03-24 MED ORDER — IBUPROFEN 600 MG PO TABS
600.0000 mg | ORAL_TABLET | Freq: Four times a day (QID) | ORAL | Status: DC
  Administered 2024-03-25 – 2024-03-26 (×5): 600 mg via ORAL
  Filled 2024-03-24 (×5): qty 1

## 2024-03-24 MED ORDER — TRANEXAMIC ACID-NACL 1000-0.7 MG/100ML-% IV SOLN
INTRAVENOUS | Status: AC
  Filled 2024-03-24: qty 100

## 2024-03-24 MED ORDER — MENTHOL 3 MG MT LOZG: 1.0000 | LOZENGE | OROMUCOSAL | Status: DC | PRN

## 2024-03-24 MED ORDER — ONDANSETRON HCL 4 MG/2ML IJ SOLN: 4.0000 mg | Freq: Three times a day (TID) | INTRAMUSCULAR | Status: DC | PRN

## 2024-03-24 MED ORDER — SIMETHICONE 80 MG PO CHEW
80.0000 mg | CHEWABLE_TABLET | Freq: Three times a day (TID) | ORAL | Status: DC
  Administered 2024-03-24 – 2024-03-26 (×6): 80 mg via ORAL
  Filled 2024-03-24 (×6): qty 1

## 2024-03-24 MED ORDER — WITCH HAZEL-GLYCERIN EX PADS: 1.0000 | MEDICATED_PAD | CUTANEOUS | Status: DC | PRN

## 2024-03-24 MED ORDER — MORPHINE SULFATE (PF) 0.5 MG/ML IJ SOLN
INTRAMUSCULAR | Status: AC
  Filled 2024-03-24: qty 10

## 2024-03-24 MED ORDER — FENTANYL CITRATE (PF) 100 MCG/2ML IJ SOLN
INTRAMUSCULAR | Status: AC
  Filled 2024-03-24: qty 2

## 2024-03-24 MED ORDER — DIPHENHYDRAMINE HCL 25 MG PO CAPS: 25.0000 mg | ORAL_CAPSULE | Freq: Four times a day (QID) | ORAL | Status: DC | PRN

## 2024-03-24 SURGICAL SUPPLY — 36 items
BARRIER ADHS 3X4 INTERCEED (GAUZE/BANDAGES/DRESSINGS) IMPLANT
BENZOIN TINCTURE PRP APPL 2/3 (GAUZE/BANDAGES/DRESSINGS) IMPLANT
CANISTER PREVENA PLUS 150 (CANNISTER) IMPLANT
CHLORAPREP W/TINT 26 (MISCELLANEOUS) ×4 IMPLANT
CLAMP UMBILICAL CORD (MISCELLANEOUS) ×2 IMPLANT
CLOTH BEACON ORANGE TIMEOUT ST (SAFETY) ×2 IMPLANT
DISSECTOR SURG LIGASURE 21 (MISCELLANEOUS) IMPLANT
DRESSING PREVENA PLUS CUSTOM (GAUZE/BANDAGES/DRESSINGS) IMPLANT
DRSG OPSITE POSTOP 4X10 (GAUZE/BANDAGES/DRESSINGS) ×2 IMPLANT
ELECTRODE REM PT RTRN 9FT ADLT (ELECTROSURGICAL) ×2 IMPLANT
EXTRACTOR VACUUM KIWI (MISCELLANEOUS) IMPLANT
EXTRACTOR VACUUM M CUP 4 TUBE (SUCTIONS) IMPLANT
GLOVE BIO SURGEON STRL SZ7 (GLOVE) ×2 IMPLANT
GLOVE BIOGEL PI IND STRL 7.0 (GLOVE) ×2 IMPLANT
GOWN STRL REUS W/TWL LRG LVL3 (GOWN DISPOSABLE) ×4 IMPLANT
KIT ABG SYR 3ML LUER SLIP (SYRINGE) IMPLANT
MAT PREVALON FULL STRYKER (MISCELLANEOUS) IMPLANT
NDL HYPO 25X5/8 SAFETYGLIDE (NEEDLE) IMPLANT
NEEDLE HYPO 25X5/8 SAFETYGLIDE (NEEDLE) IMPLANT
NS IRRIG 1000ML POUR BTL (IV SOLUTION) ×2 IMPLANT
PACK C SECTION WH (CUSTOM PROCEDURE TRAY) ×2 IMPLANT
PAD OB MATERNITY 4.3X12.25 (PERSONAL CARE ITEMS) ×2 IMPLANT
RETRACTOR TRAXI PANNICULUS (MISCELLANEOUS) IMPLANT
RTRCTR C-SECT PINK 25CM LRG (MISCELLANEOUS) IMPLANT
STRIP CLOSURE SKIN 1/2X4 (GAUZE/BANDAGES/DRESSINGS) IMPLANT
SUT MNCRL 0 VIOLET CTX 36 (SUTURE) ×4 IMPLANT
SUT PLAIN 0 NONE (SUTURE) IMPLANT
SUT PLAIN ABS 2-0 CT1 27XMFL (SUTURE) IMPLANT
SUT VIC AB 0 CT1 27XBRD ANBCTR (SUTURE) ×4 IMPLANT
SUT VIC AB 2-0 CT1 TAPERPNT 27 (SUTURE) ×2 IMPLANT
SUT VIC AB 2-0 SH 27XBRD (SUTURE) ×4 IMPLANT
SUT VIC AB 4-0 KS 27 (SUTURE) ×2 IMPLANT
SUT VICRYL 0 TIES 12 18 (SUTURE) IMPLANT
TOWEL OR 17X24 6PK STRL BLUE (TOWEL DISPOSABLE) ×2 IMPLANT
TRAY FOLEY W/BAG SLVR 14FR LF (SET/KITS/TRAYS/PACK) IMPLANT
WATER STERILE IRR 1000ML POUR (IV SOLUTION) ×2 IMPLANT

## 2024-03-24 NOTE — Progress Notes (Signed)
 IOL 39.2 wks for A2GDM   Feels some contraction pain, took Fentanyl  BP (!) 99/57   Pulse 87   Temp 98.1 F (36.7 C) (Oral)   Resp 19   Ht 5\' 3"  (1.6 m)   Wt 128.5 kg   LMP 06/16/2023   SpO2 100%   BMI 50.18 kg/m   CBG (last 3)  Recent Labs    03/24/24 0135 03/24/24 0537 03/24/24 0545  GLUCAP 97 115* 110*     FHT: 140s, moderate variability, +accels, no decels cat I   overnight variables resolved  TOCO:reg ctx q 2-3 minutes pitocin down to 2 mu  SVD  Complete -2    A/P: 38 y/o G2P1001 at 39.2 wks IOL - pitocin per protocol; continue to process, guarded for SVD Gdma2 - EFW 8 lbs, BS q 4 hrs  Hypothyroid- synthroid Rubella NI - MMR PP Rh pos Gbs neg Bmi 50  Terri Fester MD

## 2024-03-24 NOTE — Progress Notes (Signed)
 Patient ID: Morgan Douglas, female   DOB: 11-06-1986, 38 y.o.   MRN: [id number]  FHT assessed - 150s subtle late decels, changed position and resolved  Toco q 2-3 min SVE +1 after 1 hr passive descent Start pushing

## 2024-03-24 NOTE — Op Note (Signed)
 Cesarean Section Procedure Note   Morgan Douglas  03/24/2024  Procedure: Primary low transverse cesarean section and bilateral tubal sterilization by salpingectomy  Indications: Arrest if descent  4 hours second stage, 2.5 hours pushing  variable decels with pushing  Pre-operative Diagnosis: Unscheduled Cesarean Section, Arrest of descent and fetal heart rate indications.   Post-operative Diagnosis: Same   Surgeon: Terri Fester, MD   Assistants: Warren Haber CNM   Anesthesia: epidural   Procedure Details:  The patient was seen in the Labor Room. The risks, benefits, complications, treatment options, and expected outcomes were discussed with the patient. The patient concurred with the proposed plan, giving informed consent. identified as Richard Champion and the procedure verified as C-Section Delivery. A Time Out was held and the above information confirmed. 3 gm Ancef and 500 mg Azithromycin  given and TXA 1000mg  given for BMI and long labor with increased risk of hemorrhage.  After induction of anesthesia, the patient was draped and prepped in the usual sterile manner, foley was draining urine well.  A pfannenstiel incision was made and carried down through the subcutaneous tissue to the fascia. Fascial incision was made and extended transversely. The fascia was separated from the underlying rectus tissue superiorly and inferiorly. The peritoneum was identified and entered. Peritoneal incision was extended longitudinally. Alexis-O retractor placed. The utero-vesical peritoneal reflection was incised transversely and the bladder flap was bluntly freed from the lower uterine segment. A low transverse uterine incision was made. Baby was in ROT in mid pelvis with asynclitic head. Delivered from cephalic presentation was a FEMALE infant with vigorous cry. Apgar scores of 8 at one minute and 8 at five minutes. Delayed cord clamping done at 1 minute and baby handed to NICU team in attendance.  Cord ph was not sent. Cord blood was obtained for evaluation. The placenta was removed Intact and appeared normal. The uterine outline noted two extensions. Extensions assessed and Rings placed. Both tubes and ovaries appeared normal. The uterine incision was closed with running locked sutures of 0 Monocryl in two layers due to extensions and bleeding. Right angle had persistent active pumping bleeding, 3 separate figure of eight stitches placed with 0-Vicryl while supporting lateral uterus with left hand. I did not exteriorize the uterus due to its size. Hemostasis was observed. Arista sprayed.  Now bilateral salpingectomy done with Ligasure and hemostasis noted. Alexis retractor removed. Peritoneal closure done with 2-0 Vicryl.  Interceed placed. The fascia was then reapproximated with running sutures of 0Vicryl. The subcuticular closure was performed using 2-0plain gut. The skin was closed with 4-0Vicryl. Pravena dressing placed.  Another 3gm Ancef given due ot EBL >1500 cc  Instrument, sponge, and needle counts were correct prior the abdominal closure and were correct at the conclusion of the case.   Findings: Deep transverse arrest at 0 with wedged head. Delivery cephalic with bilateral uterine extensions and hemorrhage.    Estimated Blood Loss: 1823 mL   Total IV Fluids: 2500 ml LR    Urine Output: 200 cc clear in foley  Specimens: cord blood, both fallopian tubes   Complications: hemorrhage   Disposition: PACU - hemodynamically stable.   Maternal Condition: stable   Baby condition / location:  Couplet care / Skin to Skin  Attending Attestation: I performed the procedure.   Signed: Surgeon(s): Terri Fester, MD

## 2024-03-24 NOTE — Anesthesia Procedure Notes (Signed)
 Epidural Patient location during procedure: OB Start time: 03/24/2024 3:47 AM End time: 03/24/2024 3:56 AM  Staffing Anesthesiologist: Tura Gaines, MD Performed: anesthesiologist   Preanesthetic Checklist Completed: patient identified, IV checked, site marked, risks and benefits discussed, surgical consent, monitors and equipment checked, pre-op evaluation and timeout performed  Epidural Patient position: sitting Prep: DuraPrep and site prepped and draped Patient monitoring: continuous pulse ox and blood pressure Approach: midline Location: L3-L4 Injection technique: LOR air  Needle:  Needle type: Tuohy  Needle gauge: 17 G Needle length: 9 cm and 9 Needle insertion depth: 6 cm Catheter type: closed end flexible Catheter size: 19 Gauge Catheter at skin depth: 11 cm Test dose: negative and Other  Assessment Events: blood not aspirated, no cerebrospinal fluid, injection not painful, no injection resistance, no paresthesia and negative IV test  Additional Notes Patient identified. Risks and benefits discussed including failed block, incomplete  Pain control, post dural puncture headache, nerve damage, paralysis, blood pressure Changes, nausea, vomiting, reactions to medications-both toxic and allergic and post Partum back pain. All questions were answered. Patient expressed understanding and wished to proceed. Sterile technique was used throughout procedure. Epidural site was Dressed with sterile barrier dressing. No paresthesias, signs of intravascular injection Or signs of intrathecal spread were encountered.  Patient was more comfortable after the epidural was dosed. Please see RN's note for documentation of vital signs and FHR which are stable. Reason for block:procedure for pain

## 2024-03-24 NOTE — Transfer of Care (Signed)
 Immediate Anesthesia Transfer of Care Note  Patient: Morgan Douglas  Procedure(s) Performed: CESAREAN DELIVERY LIGATION, FALLOPIAN TUBE, BILATERAL (Bilateral: Abdomen)  Patient Location: PACU  Anesthesia Type:Epidural  Level of Consciousness: awake, alert , and oriented  Airway & Oxygen Therapy: Patient Spontanous Breathing  Post-op Assessment: Report given to RN and Post -op Vital signs reviewed and stable  Post vital signs: Reviewed and stable  Last Vitals:  Vitals Value Taken Time  BP 104/74 03/24/24 1315  Temp    Pulse 93 03/24/24 1322  Resp    SpO2 98 % 03/24/24 1322  Vitals shown include unfiled device data.  Last Pain:  Vitals:   03/24/24 0928  TempSrc: Oral  PainSc: 0-No pain         Complications: No notable events documented.

## 2024-03-24 NOTE — Anesthesia Postprocedure Evaluation (Signed)
 Anesthesia Post Note  Patient: Morgan Douglas  Procedure(s) Performed: CESAREAN DELIVERY LIGATION, FALLOPIAN TUBE, BILATERAL (Bilateral: Abdomen)     Patient location during evaluation: PACU Anesthesia Type: Epidural Level of consciousness: awake and alert Pain management: pain level controlled Vital Signs Assessment: post-procedure vital signs reviewed and stable Respiratory status: spontaneous breathing, nonlabored ventilation and respiratory function stable Cardiovascular status: blood pressure returned to baseline Postop Assessment: epidural receding, no apparent nausea or vomiting, no headache and no backache Anesthetic complications: no   No notable events documented.  Last Vitals:  Vitals:   03/24/24 1345 03/24/24 1400  BP: 120/74 115/66  Pulse: 87 86  Resp:    Temp: (!) 36.4 C   SpO2: 99% 98%    Last Pain:  Vitals:   03/24/24 1415  TempSrc:   PainSc: 0-No pain   Pain Goal:    LLE Motor Response: Purposeful movement (03/24/24 1400) LLE Sensation: Tingling (03/24/24 1400) RLE Motor Response: Purposeful movement (03/24/24 1400) RLE Sensation: Tingling (03/24/24 1400)     Epidural/Spinal Function Cutaneous sensation: Able to Discern Pressure (03/24/24 1400), Patient able to flex knees: Yes (03/24/24 1400), Patient able to lift hips off bed: No (03/24/24 1400), Back pain beyond tenderness at insertion site: No (03/24/24 1400), Progressively worsening motor and/or sensory loss: No (03/24/24 1400), Bowel and/or bladder incontinence post epidural: No (03/24/24 1400)  Rayfield Cairo

## 2024-03-24 NOTE — Progress Notes (Signed)
 Patient ID: Morgan Douglas, female   DOB: 08-18-1986, 38 y.o.   MRN: [id number] IOL A2GDM  G1- 6 lb VAVD  Now complete at 7.20 am and pushing since 8.30 am. No progress past +1 station despite good effort.  Recommend C-section, pt accepts now.  Reviewed she can have tubal sterilization if not having more kids, She is afraid of pain,. Reviewed no more pain than C-section recovery, accepts. Husband and pt's mother in room and encouraging her to have sterilization operation while under C/section. Reviewed procedure and recovery and irreversible nature. Says this was unplanned pregnancy and accepts TL as her birth control plan  Plan Urgent primary c-section and bilateral salpingectomy  Risks/complications of surgery reviewed incl infection, bleeding, damage to internal organs including bladder, bowels, ureters, blood vessels, other risks from anesthesia, VTE and delayed complications of any surgery, complications in future surgery reviewed. Also discussed neonatal complications incl difficult delivery, laceration, vacuum assistance, TTN etc. Pt understands and agrees, all concerns addressed.

## 2024-03-24 NOTE — Progress Notes (Signed)
 IOL 39.2 wks for A2GDM   Feels some contraction pain, took Fentanyl  BP 123/76   Pulse 91   Temp 98.4 F (36.9 C) (Axillary)   Resp 19   Ht 5\' 3"  (1.6 m)   Wt 128.5 kg   LMP 06/16/2023   BMI 50.18 kg/m   CBG (last 3)  Recent Labs    03/23/24 1742 03/23/24 2156 03/24/24 0135  GLUCAP 83 89 97     FHT: 140s, moderate variability, +accels, no decels cat I  TOCO:reg ctx q 2-3 minutes pitocin  SVD 5/50%/-4 head at inlet. Can reach, is more centered  AROM after counseling, clear fluid.   A/P: 38 y/o G2P1001 at 39.1 wks IOL - pitocin per protocol; continue to process, guarded for SVD Gdma2 - EFW 8 lbs, BS q 4 hrs  Hypothyroid- synthroid Rubella NI - MMR PP Rh pos Gbs neg Bmi 50  Terri Fester MD

## 2024-03-24 NOTE — Lactation Note (Signed)
 This note was copied from a baby's chart. Lactation Consultation Note  Patient Name: Morgan Douglas [id number] Date: 03/24/2024 Age:38 hours Reason for consult: Term  Mom said NO to [facility] services in L&D, and conforms upon arrival to [facility]- NO to lactation services for length of stay. Understands she can call out for assistance as desired.   Feeding Mother's Current Feeding Choice: Breast Milk   Consult Status Consult Status: Complete (mother declined follow up)    [facility] 03/24/2024, 3:33 PM

## 2024-03-24 NOTE — Anesthesia Preprocedure Evaluation (Addendum)
 Anesthesia Evaluation  Patient identified by MRN, date of birth, ID band Patient awake    Reviewed: Allergy & Precautions, NPO status , Patient's Chart, lab work & pertinent test results  History of Anesthesia Complications Negative for: history of anesthetic complications  Airway Mallampati: III       Dental no notable dental hx. (+) Teeth Intact, Dental Advisory Given   Pulmonary neg pulmonary ROS   Pulmonary exam normal        Cardiovascular negative cardio ROS Normal cardiovascular exam Rhythm:Regular     Neuro/Psych negative neurological ROS  negative psych ROS   GI/Hepatic Neg liver ROS,GERD  ,,  Endo/Other  diabetes, Well Controlled, GestationalHypothyroidism  Class 4 obesity  Renal/GU negative Renal ROS  negative genitourinary   Musculoskeletal negative musculoskeletal ROS (+)    Abdominal  (+) + obese  Peds  Hematology  (+) Blood dyscrasia, anemia   Anesthesia Other Findings   Reproductive/Obstetrics (+) Pregnancy                             Anesthesia Physical Anesthesia Plan  ASA: 3 and emergent  Anesthesia Plan: Epidural   Post-op Pain Management: Ofirmev IV (intra-op)*   Induction: Intravenous  PONV Risk Score and Plan: 3 and Treatment may vary due to age or medical condition, Ondansetron and Dexamethasone  Airway Management Planned: Natural Airway  Additional Equipment: Fetal Monitoring  Intra-op Plan:   Post-operative Plan:   Informed Consent: I have reviewed the patients History and Physical, chart, labs and discussed the procedure including the risks, benefits and alternatives for the proposed anesthesia with the patient or authorized representative who has indicated his/her understanding and acceptance.       Plan Discussed with: Anesthesiologist and CRNA  Anesthesia Plan Comments: (Labor epidural to be used for urgent C/S (AOD, NRFHT). Jarrell Merritts, MD)         Anesthesia Quick Evaluation

## 2024-03-25 ENCOUNTER — Encounter (HOSPITAL_COMMUNITY): Payer: Self-pay | Admitting: Obstetrics & Gynecology

## 2024-03-25 LAB — CBC
HCT: 26.9 % — ABNORMAL LOW (ref 36.0–46.0)
Hemoglobin: 8.6 g/dL — ABNORMAL LOW (ref 12.0–15.0)
MCH: 26.7 pg (ref 26.0–34.0)
MCHC: 32 g/dL (ref 30.0–36.0)
MCV: 83.5 fL (ref 80.0–100.0)
Platelets: 211 10*3/uL (ref 150–400)
RBC: 3.22 MIL/uL — ABNORMAL LOW (ref 3.87–5.11)
RDW: 14.5 % (ref 11.5–15.5)
WBC: 10.9 10*3/uL — ABNORMAL HIGH (ref 4.0–10.5)
nRBC: 0 % (ref 0.0–0.2)

## 2024-03-25 NOTE — Lactation Note (Signed)
 This note was copied from a baby's chart. Lactation Consultation Note  Patient Name: Morgan Douglas [id number] Date: 03/25/2024 Age:38 hours Reason for consult: Initial assessment;Mother's request;Maternal endocrine disorderSee [name], MR: hx hypothyroidism, A2GDM, anemia, AMA and C/S delivery. [name] concerns: infant is not sustaining his latch is on and off the breast during feedings.  Per [name], infant has been sleepy recent since his bath and [name] was doing skin to skin when LC entered the room. [name] attempted to latch infant on her left breast using the football hold position, infant on held nipple in his mouth and did not elicit the suck and swallow response. Afterwards LC discussed hand expression and infant was given 10 mls of colostrum by spoon. [name] will continue to breastfeed infant by cues, on demand ,every 2- 3 hours, skin to skin. [name] knows to ask for further latch assistance, to call for assistance with next  latch [facility] written name on [place] in [name] room. [name] knows  if infant does not latch she knows she can hand express and given infant back colostrum by spoon. LC discussed the importance of maternal rest, meals and snacks. [name] was made aware of O/P services, breastfeeding support groups, community resources, and our phone # for post-discharge questions.      Maternal Data Does the patient have breastfeeding experience prior to this delivery?: Yes How long did the patient breastfeed?: Per [name], she breastfeed her 24 year old son for 6 months  Feeding Mother's Current Feeding Choice: Breast Milk  LATCH Score Latch: Too sleepy or reluctant, no latch achieved, no sucking elicited.  Audible Swallowing: None  Type of Nipple: Everted at rest and after stimulation  Comfort (Breast/Nipple): Soft / non-tender  Hold (Positioning): Assistance needed to correctly position infant at breast and maintain latch.  LATCH Score: 5   Lactation Tools Discussed/Used     Interventions Interventions: Breast feeding basics reviewed;Assisted with latch;Skin to skin;Breast compression;Hand express;Adjust position;Support pillows;Position options;Expressed milk;Education;LC Services brochure  Discharge Pump: DEBP;Personal  Consult Status Consult Status: Follow-up Date: 03/26/24 Follow-up type: In-patient    Pecolia Bourbon 03/25/2024, 1:08 PM

## 2024-03-25 NOTE — Progress Notes (Signed)
 Subjective: Postpartum Day One: Cesarean Delivery Patient reports doing better today. Patient was up and ambulating in the hallway without dizziness. Reports pain very well controlled with current pain medications. She has been voiding without difficulties and passing gas. Tolerating a regular diet without N/V. No CP, SOB, or HA.  Baby boy doing well at bedside declines circumcision.    Objective: Patient Vitals for the past 24 hrs:  BP Temp Temp src Pulse Resp SpO2  03/25/24 0526 122/75 98.1 F (36.7 C) Oral 87 17 100 %  03/24/24 2139 105/73 98 F (36.7 C) -- 89 16 100 %  03/24/24 1743 119/72 98 F (36.7 C) Oral 84 16 100 %  03/24/24 1644 114/67 98.2 F (36.8 C) Oral 89 16 100 %  03/24/24 1529 117/79 98.2 F (36.8 C) Oral 85 16 97 %  03/24/24 1425 123/72 (!) 97.2 F (36.2 C) Axillary 88 -- 98 %  03/24/24 1415 114/71 -- -- 89 -- 96 %  03/24/24 1400 115/66 -- -- 86 -- 98 %  03/24/24 1345 120/74 (!) 97.5 F (36.4 C) Axillary 87 -- 99 %  03/24/24 1330 112/73 -- -- 93 -- 100 %  03/24/24 1315 104/74 -- -- 94 -- 98 %  03/24/24 1303 (!) 88/53 (!) 97.2 F (36.2 C) Oral (!) 101 20 100 %    Intake/Output Summary (Last 24 hours) at 03/25/2024 1150 Last data filed at 03/25/2024 1610 Gross per 24 hour  Intake 2000 ml  Output 3123 ml  Net -1123 ml     Physical Exam:  General: alert and no distress Lochia: appropriate Uterine Fundus: firm Incision: healing well: Pravena in place with no collection in wound vac cannister  DVT Evaluation: No evidence of DVT seen on physical exam.  Recent Labs    03/24/24 1530 03/25/24 0445  HGB 9.9* 8.6*  HCT 30.4* 26.9*    Assessment/Plan: Status post Cesarean section. Doing well postoperatively.  Continue current care. Saliah Schlie [id number] POD#1 sp primary cesarean section at [redacted]w[redacted]d 1. PPC: cont current PO pain regimen, encourage ambulation, regular diet 2. IntraOp EBL 1800cc resulting in acute on chronic blood loss anemia, POD1 Hgb  stable 8.6 and patient asymptomatic, consented for IV Fe therapy today and plan to resume PO Fe supplement at discharge 3. GDMA2 s/p Metformin, plan for PP f/u 4. Hypothyroid on Synthroid  5. RH POS 6. LC Support PRN  Routine postop inpatient care  Enis Riecke A Micco Bourbeau 03/25/2024, 11:36 AM

## 2024-03-26 DIAGNOSIS — O24429 Gestational diabetes mellitus in childbirth, unspecified control: Secondary | ICD-10-CM | POA: Insufficient documentation

## 2024-03-26 MED ORDER — OXYCODONE HCL 5 MG PO TABS
5.0000 mg | ORAL_TABLET | ORAL | 0 refills | Status: AC | PRN
Start: 2024-03-26 — End: ?

## 2024-03-26 MED ORDER — SENNOSIDES-DOCUSATE SODIUM 8.6-50 MG PO TABS: 2.0000 | ORAL_TABLET | Freq: Every day | ORAL | Status: AC

## 2024-03-26 MED ORDER — IBUPROFEN 800 MG PO TABS: 800.0000 mg | ORAL_TABLET | Freq: Three times a day (TID) | ORAL | 0 refills | Status: AC

## 2024-03-26 NOTE — Discharge Summary (Signed)
 Postpartum Discharge Summary  Date of Service updated 03/26/24     Patient Name: Morgan Douglas DOB: 03/07/1986 MRN: [id number]  Date of admission: 03/23/2024 Delivery date:03/24/2024 Delivering provider: MODY, VAISHALI Date of discharge: 03/26/2024  Admitting diagnosis: Encounter for induction of labor [Z34.90] Delivery by emergency cesarean [O99.892] Intrauterine pregnancy: [redacted]w[redacted]d     Secondary diagnosis:  Principal Problem:   Encounter for induction of labor Active Problems:   Hypothyroidism   Iron  deficiency anemia   Morbid obesity (HCC)   Delivery by emergency cesarean   Gestational diabetes mellitus (GDM), delivered  Additional problems: n/a    Discharge diagnosis: Term Pregnancy Delivered, GDM A2, and PPH                                              Post partum procedures:postpartum tubal ligation Augmentation: AROM, Pitocin , Cytotec , and IP Foley Complications: Hemorrhage>1073mL  Hospital course: Induction of Labor With Cesarean Section   38 y.o. yo G2P2002 at [redacted]w[redacted]d was admitted to the hospital 03/23/2024 for induction of labor. Patient had a labor course significant for none. The patient went for cesarean section due to Arrest of Descent. Delivery details are as follows: Membrane Rupture Time/Date: 1:28 AM,03/24/2024  Delivery Method:C-Section, Low Transverse Operative Delivery:N/A Details of operation can be found in separate operative Note.  Patient had a postpartum course complicated by PPH but stable POD1 Hgb 8.6 received IV Venofer  on POD1. She is ambulating, tolerating a regular diet, passing flatus, and urinating well.  Patient is discharged home in stable condition on 03/26/24.      Newborn Data: Birth date:03/24/2024 Birth time:11:44 AM Gender:Female Living status:Living Apgars:8 ,8  Weight:3470 g                               Magnesium Sulfate received: No BMZ received: No Rhophylac:N/A MMR:No T-DaP:? Transfusion:No Immunizations  administered: Immunization History  Administered Date(s) Administered   Influenza Inj Mdck Quad Pf 08/21/2018   Influenza Split 08/12/2018, 09/01/2019   Influenza,inj,Quad PF,6+ Mos 08/13/2017   PFIZER(Purple Top)SARS-COV-2 Vaccination 02/01/2020, 02/22/2020, 10/01/2020    Physical exam  Vitals:   03/25/24 0526 03/25/24 1418 03/25/24 2126 03/26/24 0510  BP: 122/75 118/72 121/74 121/71  Pulse: 87 87 93 86  Resp: 17 16    Temp: 98.1 F (36.7 C) 97.8 F (36.6 C) 98 F (36.7 C) 97.8 F (36.6 C)  TempSrc: Oral Oral Oral Oral  SpO2: 100% 100% 99% 100%  Weight:      Height:       General: alert and no distress Lochia: appropriate Uterine Fundus: firm Incision: Healing well with no significant drainage: Pravena in place DVT Evaluation: No evidence of DVT seen on physical exam. Labs: Lab Results  Component Value Date   WBC 10.9 (H) 03/25/2024   HGB 8.6 (L) 03/25/2024   HCT 26.9 (L) 03/25/2024   MCV 83.5 03/25/2024   PLT 211 03/25/2024       No data to display         Edinburgh Score:    03/25/2024   11:21 PM  Edinburgh Postnatal Depression Scale Screening Tool  I have been able to laugh and see the funny side of things. 0  I have looked forward with enjoyment to things. 0  I have blamed myself unnecessarily when things went  wrong. 1  I have been anxious or worried for no good reason. 0  I have felt scared or panicky for no good reason. 0  Things have been getting on top of me. 0  I have been so unhappy that I have had difficulty sleeping. 0  I have felt sad or miserable. 0  I have been so unhappy that I have been crying. 0  The thought of harming myself has occurred to me. 0  Edinburgh Postnatal Depression Scale Total 1      After visit meds:  Allergies as of 03/26/2024   No Known Allergies      Medication List     STOP taking these medications    cyanocobalamin  1000 MCG tablet       TAKE these medications    ferrous sulfate 325 (65 FE) MG  tablet Take 325 mg by mouth daily with breakfast.   ibuprofen  800 MG tablet Commonly known as: ADVIL  Take 1 tablet (800 mg total) by mouth 3 (three) times daily.   levothyroxine  200 MCG tablet Commonly known as: SYNTHROID  Take 200 mcg by mouth daily before breakfast.   oxyCODONE  5 MG immediate release tablet Commonly known as: Oxy IR/ROXICODONE  Take 1-2 tablets (5-10 mg total) by mouth every 4 (four) hours as needed for moderate pain (pain score 4-6).   PRENATAL VITAMINS PO Take by mouth.   senna-docusate 8.6-50 MG tablet Commonly known as: Senokot-S Take 2 tablets by mouth daily. Start taking on: Mar 27, 2024               Discharge Care Instructions  (From admission, onward)           Start     Ordered   03/26/24 0000  Discharge wound care:       Comments: Keep incision clean and dry, follow up in office at 7-10 days postop for dressing removal   03/26/24 1035             Discharge home in stable condition Infant Feeding: Bottle and Breast Infant Disposition:home with mother Discharge instruction: per After Visit Summary and Postpartum booklet. Activity: Advance as tolerated. Pelvic rest for 6 weeks.  Diet: routine diet Anticipated Birth Control: BTL done PP Postpartum Appointment:6 weeks Additional Postpartum F/U: Incision check 1 week Future Appointments:No future appointments. Follow up Visit:  Patient to follow up at [facility] within 7-10 days for [facility] dressing care    03/26/2024 Darren Caldron A Jimya Ciani, DO

## 2024-03-26 NOTE — Lactation Note (Signed)
 This note was copied from a baby's chart. Lactation Consultation Note  Patient Name: Morgan Douglas [id number] Date: 03/26/2024 Age:38 hours Reason for consult: Follow-up assessment;Maternal endocrine disorder  P2, Mother is breastfeeding baby and supplementing with formula.  She states she is supplementing with formula because baby does not seem full after breastfeeding. Encouraged breastfeeding and demand and if supplementing, pump for 15 min with DEBP to stimulate her supply and give volume back to baby Reviewed engorgement care and monitoring voids/stools.  Maternal Data Does the patient have breastfeeding experience prior to this delivery?: Yes  Feeding Mother's Current Feeding Choice: Breast Milk and Formula Nipple Type: Slow - flow  Interventions Interventions: Education  Discharge Discharge Education: Engorgement and breast care;Warning signs for feeding baby Pump: DEBP;Personal  Consult Status Consult Status: Complete Date: 03/26/24   Luellen Sages  RN, IBCLC 03/26/2024, 11:41 AM

## 2024-03-28 LAB — SURGICAL PATHOLOGY

## 2024-04-05 ENCOUNTER — Telehealth (HOSPITAL_COMMUNITY): Payer: Self-pay | Admitting: *Deleted

## 2024-04-05 NOTE — Telephone Encounter (Signed)
 04/05/2024  Name: Morgan Douglas MRN: [id number] DOB: [birth year]  Reason for Call:  Transition of Care Hospital Discharge Call  Contact Status: Patient Contact Status: Complete  Language assistant needed:          Follow-Up Questions: Do You Have Any Concerns About Your Health As You Heal From Delivery?: No Do You Have Any Concerns About Your Infants Health?: No  Edinburgh Postnatal Depression Scale:  In the Past 7 Days:   Patient declined at this time. Stated, "I am good. My mother is here, and my husband has taken a leave." PHQ2-9 Depression Scale:     Discharge Follow-up: Edinburgh score requires follow up?: N/A Patient was advised of the following resources:: Breastfeeding Support Group, Support Group  Post-discharge interventions: Reviewed Newborn Safe Sleep Practices  Signature Julien Odor, RN, 04/05/24, [phone]

## 2024-04-05 NOTE — Telephone Encounter (Signed)
 Patient's husband answered the phone and reported that patient was resting. Requested return call later this evening. Julien Odor, RN, 04/05/24, [phone]
